# Patient Record
Sex: Female | Born: 1950 | Race: White | Hispanic: No | Marital: Married | Smoking: Never smoker
Health system: Southern US, Community
[De-identification: ages and names within clinical notes are randomized; demographics above are authoritative.]

## PROBLEM LIST (undated history)

## (undated) DIAGNOSIS — Z9289 Personal history of other medical treatment: Secondary | ICD-10-CM

## (undated) DIAGNOSIS — M810 Age-related osteoporosis without current pathological fracture: Secondary | ICD-10-CM

## (undated) DIAGNOSIS — K219 Gastro-esophageal reflux disease without esophagitis: Secondary | ICD-10-CM

## (undated) DIAGNOSIS — Z8709 Personal history of other diseases of the respiratory system: Secondary | ICD-10-CM

## (undated) DIAGNOSIS — E039 Hypothyroidism, unspecified: Secondary | ICD-10-CM

## (undated) DIAGNOSIS — Q899 Congenital malformation, unspecified: Secondary | ICD-10-CM

## (undated) DIAGNOSIS — R011 Cardiac murmur, unspecified: Secondary | ICD-10-CM

## (undated) DIAGNOSIS — H25019 Cortical age-related cataract, unspecified eye: Secondary | ICD-10-CM

## (undated) HISTORY — PX: APPENDECTOMY: SHX54

## (undated) HISTORY — PX: COLONOSCOPY: SHX174

## (undated) HISTORY — PX: CYSTECTOMY: SUR359

## (undated) HISTORY — PX: ESOPHAGOGASTRODUODENOSCOPY: SHX1529

## (undated) HISTORY — PX: BREAST CYST ASPIRATION: SHX578

## (undated) HISTORY — PX: CHOLECYSTECTOMY: SHX55

---

## 2008-02-02 ENCOUNTER — Ambulatory Visit: Payer: Self-pay | Admitting: Family Medicine

## 2009-06-29 ENCOUNTER — Ambulatory Visit: Payer: Self-pay | Admitting: Obstetrics and Gynecology

## 2010-07-10 ENCOUNTER — Ambulatory Visit: Payer: Self-pay | Admitting: Obstetrics and Gynecology

## 2010-07-17 ENCOUNTER — Ambulatory Visit: Payer: Self-pay | Admitting: Obstetrics and Gynecology

## 2011-08-27 ENCOUNTER — Ambulatory Visit: Payer: Self-pay | Admitting: Obstetrics and Gynecology

## 2012-02-04 ENCOUNTER — Ambulatory Visit: Payer: Self-pay | Admitting: Internal Medicine

## 2012-09-01 ENCOUNTER — Ambulatory Visit: Payer: Self-pay | Admitting: Obstetrics and Gynecology

## 2018-01-01 ENCOUNTER — Other Ambulatory Visit: Payer: Self-pay

## 2018-01-02 ENCOUNTER — Other Ambulatory Visit: Payer: Self-pay

## 2018-01-02 ENCOUNTER — Encounter: Payer: Self-pay | Admitting: *Deleted

## 2018-01-05 ENCOUNTER — Other Ambulatory Visit: Payer: Self-pay

## 2018-01-05 ENCOUNTER — Encounter: Payer: Self-pay | Admitting: Orthopedic Surgery

## 2018-01-05 ENCOUNTER — Ambulatory Visit: Payer: Medicare Other | Admitting: Anesthesiology

## 2018-01-05 ENCOUNTER — Ambulatory Visit
Admission: RE | Admit: 2018-01-05 | Discharge: 2018-01-05 | Disposition: A | Discharge status: Home / Self Care | Payer: Medicare Other | Source: Ambulatory Visit | Attending: Orthopedic Surgery | Admitting: Orthopedic Surgery

## 2018-01-05 ENCOUNTER — Encounter
Admission: RE | Disposition: A | Discharge status: Home / Self Care | Payer: Self-pay | Source: Ambulatory Visit | Attending: Orthopedic Surgery

## 2018-01-05 DIAGNOSIS — E039 Hypothyroidism, unspecified: Secondary | ICD-10-CM | POA: Insufficient documentation

## 2018-01-05 DIAGNOSIS — K219 Gastro-esophageal reflux disease without esophagitis: Secondary | ICD-10-CM | POA: Diagnosis not present

## 2018-01-05 DIAGNOSIS — Z885 Allergy status to narcotic agent status: Secondary | ICD-10-CM | POA: Diagnosis not present

## 2018-01-05 DIAGNOSIS — Z79899 Other long term (current) drug therapy: Secondary | ICD-10-CM | POA: Insufficient documentation

## 2018-01-05 DIAGNOSIS — Z880 Allergy status to penicillin: Secondary | ICD-10-CM | POA: Insufficient documentation

## 2018-01-05 DIAGNOSIS — M810 Age-related osteoporosis without current pathological fracture: Secondary | ICD-10-CM | POA: Diagnosis not present

## 2018-01-05 DIAGNOSIS — R490 Dysphonia: Secondary | ICD-10-CM | POA: Insufficient documentation

## 2018-01-05 DIAGNOSIS — G5601 Carpal tunnel syndrome, right upper limb: Secondary | ICD-10-CM | POA: Diagnosis present

## 2018-01-05 DIAGNOSIS — R011 Cardiac murmur, unspecified: Secondary | ICD-10-CM | POA: Insufficient documentation

## 2018-01-05 DIAGNOSIS — Z888 Allergy status to other drugs, medicaments and biological substances status: Secondary | ICD-10-CM | POA: Insufficient documentation

## 2018-01-05 DIAGNOSIS — Z01810 Encounter for preprocedural cardiovascular examination: Secondary | ICD-10-CM | POA: Diagnosis not present

## 2018-01-05 DIAGNOSIS — G5603 Carpal tunnel syndrome, bilateral upper limbs: Secondary | ICD-10-CM | POA: Insufficient documentation

## 2018-01-05 DIAGNOSIS — Z9049 Acquired absence of other specified parts of digestive tract: Secondary | ICD-10-CM | POA: Insufficient documentation

## 2018-01-05 HISTORY — DX: Hypothyroidism, unspecified: E03.9

## 2018-01-05 HISTORY — DX: Age-related osteoporosis without current pathological fracture: M81.0

## 2018-01-05 HISTORY — DX: Personal history of other medical treatment: Z92.89

## 2018-01-05 HISTORY — DX: Cortical age-related cataract, unspecified eye: H25.019

## 2018-01-05 HISTORY — DX: Gastro-esophageal reflux disease without esophagitis: K21.9

## 2018-01-05 HISTORY — DX: Cardiac murmur, unspecified: R01.1

## 2018-01-05 HISTORY — DX: Congenital malformation, unspecified: Q89.9

## 2018-01-05 HISTORY — PX: CARPAL TUNNEL RELEASE: SHX101

## 2018-01-05 HISTORY — DX: Personal history of other diseases of the respiratory system: Z87.09

## 2018-01-05 SURGERY — CARPAL TUNNEL RELEASE
Anesthesia: Regional | Laterality: Right

## 2018-01-05 MED ORDER — FENTANYL CITRATE (PF) 100 MCG/2ML IJ SOLN: 25.0000 ug | INTRAMUSCULAR | Status: DC | PRN

## 2018-01-05 MED ORDER — HYDROCODONE-ACETAMINOPHEN 5-325 MG PO TABS: 1.0000 | ORAL_TABLET | ORAL | 0 refills | Status: AC | PRN

## 2018-01-05 MED ORDER — PROPOFOL 500 MG/50ML IV EMUL
INTRAVENOUS | Status: DC | PRN
  Administered 2018-01-05: 50 ug/kg/min via INTRAVENOUS

## 2018-01-05 MED ORDER — LIDOCAINE HCL (PF) 0.5 % IJ SOLN
INTRAMUSCULAR | Status: DC | PRN
  Administered 2018-01-05: 50 mL via INTRAVENOUS

## 2018-01-05 MED ORDER — PROPOFOL 10 MG/ML IV BOLUS
INTRAVENOUS | Status: AC
  Filled 2018-01-05: qty 20

## 2018-01-05 MED ORDER — KETAMINE HCL 50 MG/ML IJ SOLN
INTRAMUSCULAR | Status: AC
  Filled 2018-01-05: qty 10

## 2018-01-05 MED ORDER — LACTATED RINGERS IV SOLN
INTRAVENOUS | Status: DC
  Administered 2018-01-05: 13:00:00 via INTRAVENOUS

## 2018-01-05 MED ORDER — ACETAMINOPHEN 10 MG/ML IV SOLN
INTRAVENOUS | Status: DC | PRN
  Administered 2018-01-05: 1000 mg via INTRAVENOUS

## 2018-01-05 MED ORDER — BUPIVACAINE HCL (PF) 0.25 % IJ SOLN
INTRAMUSCULAR | Status: DC | PRN
  Administered 2018-01-05: 10 mL

## 2018-01-05 MED ORDER — LIDOCAINE HCL (PF) 0.5 % IJ SOLN
INTRAMUSCULAR | Status: AC
  Filled 2018-01-05: qty 50

## 2018-01-05 MED ORDER — MIDAZOLAM HCL 5 MG/5ML IJ SOLN
INTRAMUSCULAR | Status: DC | PRN
  Administered 2018-01-05: 2 mg via INTRAVENOUS

## 2018-01-05 MED ORDER — HYDROCODONE-ACETAMINOPHEN 5-325 MG PO TABS
ORAL_TABLET | ORAL | Status: AC
  Administered 2018-01-05: 1
  Filled 2018-01-05: qty 1

## 2018-01-05 MED ORDER — SODIUM CHLORIDE FLUSH 0.9 % IV SOLN
INTRAVENOUS | Status: AC
  Filled 2018-01-05: qty 10

## 2018-01-05 MED ORDER — ACETAMINOPHEN 10 MG/ML IV SOLN
INTRAVENOUS | Status: AC
  Filled 2018-01-05: qty 100

## 2018-01-05 MED ORDER — NEOMYCIN-POLYMYXIN B GU 40-200000 IR SOLN
Status: DC | PRN
  Administered 2018-01-05: 2 mL

## 2018-01-05 MED ORDER — MIDAZOLAM HCL 2 MG/2ML IJ SOLN
INTRAMUSCULAR | Status: AC
  Filled 2018-01-05: qty 2

## 2018-01-05 MED ORDER — KETAMINE HCL 50 MG/ML IJ SOLN
INTRAMUSCULAR | Status: DC | PRN
  Administered 2018-01-05: 25 mg via INTRAMUSCULAR

## 2018-01-05 MED ORDER — CHLORHEXIDINE GLUCONATE 4 % EX LIQD
60.0000 mL | Freq: Once | CUTANEOUS | Status: AC
  Administered 2018-01-05: 4 via TOPICAL

## 2018-01-05 SURGICAL SUPPLY — 26 items
BANDAGE ELASTIC 3 LF NS (GAUZE/BANDAGES/DRESSINGS) ×3 IMPLANT
BNDG ESMARK 4X12 TAN STRL LF (GAUZE/BANDAGES/DRESSINGS) ×3 IMPLANT
CANISTER SUCT 1200ML W/VALVE (MISCELLANEOUS) ×3 IMPLANT
CAST PADDING 3X4FT ST 30246 (SOFTGOODS) ×2
CUFF DUAL TOURNIQUET 18IN DISP (TOURNIQUET CUFF) ×2 IMPLANT
CUFF TOURN 18 STER (MISCELLANEOUS) ×3 IMPLANT
DRSG DERMACEA 8X12 NADH (GAUZE/BANDAGES/DRESSINGS) ×3 IMPLANT
DURAPREP 26ML APPLICATOR (WOUND CARE) ×3 IMPLANT
ELECT CAUTERY BLADE 6.4 (BLADE) ×3 IMPLANT
ELECT REM PT RETURN 9FT ADLT (ELECTROSURGICAL) ×3
ELECTRODE REM PT RTRN 9FT ADLT (ELECTROSURGICAL) ×1 IMPLANT
GAUZE SPONGE 4X4 12PLY STRL (GAUZE/BANDAGES/DRESSINGS) ×3 IMPLANT
GLOVE BIOGEL M STRL SZ7.5 (GLOVE) ×3 IMPLANT
GLOVE INDICATOR 8.0 STRL GRN (GLOVE) ×3 IMPLANT
GOWN STRL REUS W/ TWL LRG LVL3 (GOWN DISPOSABLE) ×2 IMPLANT
GOWN STRL REUS W/TWL LRG LVL3 (GOWN DISPOSABLE) ×4
KIT TURNOVER KIT A (KITS) ×3 IMPLANT
NS IRRIG 500ML POUR BTL (IV SOLUTION) ×3 IMPLANT
PACK EXTREMITY ARMC (MISCELLANEOUS) ×3 IMPLANT
PAD CAST CTTN 3X4 STRL (SOFTGOODS) ×1 IMPLANT
SOL PREP PVP 2OZ (MISCELLANEOUS) ×3
SOLUTION PREP PVP 2OZ (MISCELLANEOUS) ×1 IMPLANT
SPLINT CAST 1 STEP 3X12 (MISCELLANEOUS) ×3 IMPLANT
STOCKINETTE 48X4 2 PLY STRL (GAUZE/BANDAGES/DRESSINGS) ×1 IMPLANT
STOCKINETTE STRL 4IN 9604848 (GAUZE/BANDAGES/DRESSINGS) ×3 IMPLANT
SUT ETHILON 5-0 FS-2 18 BLK (SUTURE) ×3 IMPLANT

## 2018-01-05 NOTE — Discharge Instructions (Signed)
  Instructions after Hand / Wrist Surgery   James P. Hooten, Jr., M.D.  Dept. of Orthopaedics & Sports Medicine  [facility]  [address]  [place], [place]  [address]   Phone: [phone]   Fax: [phone]   DIET: . Drink plenty of non-alcoholic fluids & begin a light diet. . Resume your normal diet the day after surgery.  ACTIVITY:  . Keep the hand elevated above the level of the elbow. . Begin gently moving the fingers on a regular basis to avoid stiffness. . Avoid any heavy lifting, pushing, or pulling with the operative hand. . Do not drive or operate any equipment until instructed.  WOUND CARE:  . Keep the splint/bandage clean and dry.  . The splint and stitches will be removed in the office. . Continue to use the ice packs periodically to reduce pain and swelling. . You may bathe or shower after the stitches are removed at the first office visit following surgery.  MEDICATIONS: . You may resume your regular medications. . Please take the pain medication as prescribed. . Do not take pain medication on an empty stomach. . Do not drive or drink alcoholic beverages when taking pain medications.  CALL THE OFFICE FOR: . Temperature above 101 degrees . Excessive bleeding or drainage on the dressing. . Excessive swelling, coldness, or paleness of the fingers. . Persistent nausea and vomiting.  FOLLOW-UP:  . You should have an appointment to return to the office in 7-10 days after surgery.   REMEMBER: R.I.C.E. = Rest, Ice, Compression, Elevation !    AMBULATORY SURGERY  DISCHARGE INSTRUCTIONS   1) The drugs that you were given will stay in your system until tomorrow so for the next 24 hours you should not:  A) Drive an automobile B) Make any legal decisions C) Drink any alcoholic beverage   2) You may resume regular meals tomorrow.  Today it is better to start with liquids and gradually work up to solid foods.  You may eat anything you prefer, but it  is better to start with liquids, then soup and crackers, and gradually work up to solid foods.   3) Please notify your doctor immediately if you have any unusual bleeding, trouble breathing, redness and pain at the surgery site, drainage, fever, or pain not relieved by medication.    4) Additional Instructions: TAKE A STOOL SOFTENER TWICE A DAY WHILE TAKING NARCOTIC PAIN MEDICINE TO PREVENT CONSTIPATION   Please contact your physician with any problems or Same Day Surgery at [phone], Monday through Friday 6 am to 4 pm, or [place] [facility] number at [phone].   

## 2018-01-05 NOTE — Anesthesia Preprocedure Evaluation (Addendum)
Anesthesia Evaluation  Patient identified by MRN, date of birth, ID band Patient awake    Reviewed: Allergy & Precautions, H&P , NPO status , Patient's Chart, lab work & pertinent test results  History of Anesthesia Complications Negative for: history of anesthetic complications  Airway Mallampati: III  TM Distance: <3 FB Neck ROM: limited    Dental  (+) Chipped, Poor Dentition   Pulmonary neg pulmonary ROS, neg shortness of breath,           Cardiovascular Exercise Tolerance: Good (-) angina(-) Past MI and (-) DOE + Valvular Problems/Murmurs      Neuro/Psych negative neurological ROS  negative psych ROS   GI/Hepatic Neg liver ROS, GERD  ,  Endo/Other  Hypothyroidism   Renal/GU negative Renal ROS  negative genitourinary   Musculoskeletal   Abdominal   Peds  Hematology negative hematology ROS (+)   Anesthesia Other Findings Patient has vocal hoarseness at baseline  Past Medical History: No date: Cataract cortical, 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 No date: GERD (gastroesophageal reflux disease) No date: H/O vocal cord paralysis No date: Heart murmur No date: History of blood transfusion No date: Hypothyroidism No date: Osteoporosis  Past Surgical History: No date: APPENDECTOMY No date: CHOLECYSTECTOMY No date: COLONOSCOPY No date: CYSTECTOMY     Comment:  voal cord, granuloma from vocal cord No date: ESOPHAGOGASTRODUODENOSCOPY  BMI    Body Mass Index:  25.06 kg/m      Reproductive/Obstetrics negative OB ROS                            Anesthesia Physical Anesthesia Plan  ASA: III  Anesthesia Plan: General and Regional   Post-op Pain Management:    Induction: Intravenous  PONV Risk Score and Plan: Ondansetron, Dexamethasone, Propofol infusion, Midazolam and TIVA  Airway Management Planned: Natural Airway and Nasal Cannula  Additional Equipment:   Intra-op Plan:    Post-operative Plan:   Informed Consent: I have reviewed the patients History and Physical, chart, labs and discussed the procedure including the risks, benefits and alternatives for the proposed anesthesia with the patient or authorized representative who has indicated his/her understanding and acceptance.   Dental Advisory Given  Plan Discussed with: Anesthesiologist, CRNA and Surgeon  Anesthesia Plan Comments: (Patient request Bier block  Patient consented for risks of anesthesia including but not limited to:  - adverse reactions to medications - risk of intubation if required - damage to teeth, lips or other oral mucosa - sore throat or hoarseness - Damage to heart, brain, lungs or loss of life  Patient voiced understanding.)        Anesthesia Quick Evaluation

## 2018-01-05 NOTE — Transfer of Care (Signed)
Immediate Anesthesia Transfer of Care Note  Patient: Tabitha Anderson  Procedure(s) Performed: CARPAL TUNNEL RELEASE (Right )  Patient Location: PACU  Anesthesia Type:Bier block  Level of Consciousness: awake, alert  and oriented  Airway & Oxygen Therapy: Patient Spontanous Breathing  Post-op Assessment: Report given to RN and Post -op Vital signs reviewed and stable  Post vital signs: Reviewed and stable  Last Vitals:  Vitals Value Taken Time  BP 119/69 01/05/2018  3:53 PM  Temp    Pulse 71 01/05/2018  3:53 PM  Resp 15 01/05/2018  3:53 PM  SpO2 97 % 01/05/2018  3:53 PM  Vitals shown include unvalidated device data.  Last Pain:  Vitals:   01/05/18 1300  TempSrc: Temporal  PainSc: 0-No pain         Complications: No apparent anesthesia complications

## 2018-01-05 NOTE — H&P (Signed)
The patient has been re-examined, and the chart reviewed, and there have been no interval changes to the documented history and physical.    The risks, benefits, and alternatives have been discussed at length. The patient expressed understanding of the risks benefits and agreed with plans for surgical intervention.  James P. Hooten, Jr. M.D.    

## 2018-01-05 NOTE — Anesthesia Procedure Notes (Signed)
Anesthesia Regional Block: Bier block (IV Regional)   Pre-Anesthetic Checklist: ,, timeout performed, Correct Patient, Correct Site, Correct Laterality, Correct Procedure, Correct Position, site marked, Risks and benefits discussed,  Surgical consent,  Pre-op evaluation,  At surgeon's request and post-op pain management  Laterality: Upper and Right     Needles:  Injection technique: Single-shot  Needle Type: Other          Additional Needles:   Procedures:,,,,,, Esmarch exsanguination,, double tourniquet utilized  Narrative:  Start time: 01/05/2018 2:55 PM End time: 01/05/2018 2:57 PM Injection made incrementally with aspirations every 5 mL.  Performed by: With CRNAs  Anesthesiologist: Loyalty Brashier, Cleda MccreedyJoseph K, MD CRNA: Omer JackWeatherly, Janice, CRNA  Additional Notes: Functioning IV was confirmed and monitors were applied.  The patient tolerated the procedure well with no immediate complications.

## 2018-01-05 NOTE — OR Nursing (Signed)
Incentive spirometer taught with return demonstration.

## 2018-01-05 NOTE — Anesthesia Post-op Follow-up Note (Signed)
Anesthesia QCDR form completed.        

## 2018-01-05 NOTE — Op Note (Signed)
OPERATIVE NOTE  DATE OF SURGERY:  01/05/2018  PATIENT NAME:  Tabitha Anderson   DOB: Oct 21, 1950  MRN: [id number]  PRE-OPERATIVE DIAGNOSIS: Right carpal tunnel syndrome  POST-OPERATIVE DIAGNOSIS:  Same  PROCEDURE:  Right carpal tunnel release  SURGEON:  Jena GaussJames P Giovannie Scerbo, Jr. M.D.  ANESTHESIA: Bier block  ESTIMATED BLOOD LOSS: Minimal  FLUIDS REPLACED: 500 mL of crystalloid  TOURNIQUET TIME: 41 minutes  DRAINS: None  INDICATIONS FOR SURGERY: Tabitha Anderson is a 67 y.o. year old female with a long history of numbness and paresthesias to the right hand. EMG/nerve conduction studies demonstrated findings consistent with carpal tunnel syndrome.The patient had not seen any significant improvement despite conservative nonsurgical intervention. After discussion of the risks and benefits of surgical intervention, the patient expressed understanding of the risks benefits and agree with plans for carpal tunnel release.   PROCEDURE IN DETAIL: The patient was brought into the operating room and after adequate Bier block,the right hand and arm were prepped with alcohol and Duraprep and draped in the usual sterile fashion. A "time-out" was performed as per usual protocol.Tabitha Anderson. Loupe magnification was used throughout the procedure. An incision was made just ulnar to the thenar palmar crease. Dissection was carried down through the palmar fascia to the transverse carpal ligament. The transverse carpal ligament was sharply incised, taking care to protect the underlying structures with the carpal tunnel. Complete release of the transverse carpal ligament was achieved. There was no evidence of ganglion cyst or lipoma within the carpal tunnel. The wound was irrigated with copious amounts of normal saline with antibiotic solution. The skin was then re-approximated with interrupted sutures of #5-0 nylon. A sterile dressing was applied followed by application of a volar splint. The tourniquet was deflated with a total  tourniquet time of 41 minutes.  The patient tolerated the procedure well and was transported to the PACU in stable condition.  Tabitha Anderson, Jr., M.D.

## 2018-01-06 ENCOUNTER — Encounter: Payer: Self-pay | Admitting: Orthopedic Surgery

## 2018-01-07 NOTE — Anesthesia Postprocedure Evaluation (Signed)
Anesthesia Post Note  Patient: Tabitha Anderson  Procedure(s) Performed: CARPAL TUNNEL RELEASE (Right )  Patient location during evaluation: PACU Anesthesia Type: Regional Level of consciousness: awake and alert Pain management: pain level controlled Vital Signs Assessment: post-procedure vital signs reviewed and stable Respiratory status: spontaneous breathing, nonlabored ventilation, respiratory function stable and patient connected to nasal cannula oxygen Cardiovascular status: blood pressure returned to baseline and stable Postop Assessment: no apparent nausea or vomiting Anesthetic complications: no     Last Vitals:  Vitals:   01/05/18 1639 01/05/18 1723  BP: (!) 144/79 128/73  Pulse: (!) 59 67  Resp: 14 14  Temp: (!) 36.1 C   SpO2: 100% 100%    Last Pain:  Vitals:   01/06/18 0829  TempSrc:   PainSc: 0-No pain                 Cleda MccreedyJoseph K Ivis Henneman

## 2018-07-29 ENCOUNTER — Other Ambulatory Visit: Payer: Self-pay | Admitting: Family Medicine

## 2018-07-29 DIAGNOSIS — Z1231 Encounter for screening mammogram for malignant neoplasm of breast: Secondary | ICD-10-CM

## 2018-09-24 ENCOUNTER — Ambulatory Visit: Payer: Medicare Other

## 2018-09-24 ENCOUNTER — Ambulatory Visit
Admission: RE | Admit: 2018-09-24 | Discharge: 2018-09-24 | Disposition: A | Discharge status: Home / Self Care | Payer: Medicare Other | Source: Ambulatory Visit | Attending: Family Medicine | Admitting: Family Medicine

## 2018-09-24 ENCOUNTER — Encounter (INDEPENDENT_AMBULATORY_CARE_PROVIDER_SITE_OTHER): Payer: Self-pay

## 2018-09-24 DIAGNOSIS — Z1231 Encounter for screening mammogram for malignant neoplasm of breast: Secondary | ICD-10-CM | POA: Diagnosis present

## 2018-09-30 ENCOUNTER — Inpatient Hospital Stay: Admission: RE | Admit: 2018-09-30 | Payer: Medicare Other | Source: Ambulatory Visit

## 2019-08-06 ENCOUNTER — Other Ambulatory Visit: Payer: Self-pay | Admitting: Family Medicine

## 2019-08-06 DIAGNOSIS — Z1231 Encounter for screening mammogram for malignant neoplasm of breast: Secondary | ICD-10-CM

## 2019-08-06 DIAGNOSIS — M810 Age-related osteoporosis without current pathological fracture: Secondary | ICD-10-CM

## 2019-10-05 ENCOUNTER — Ambulatory Visit
Admission: RE | Admit: 2019-10-05 | Discharge: 2019-10-05 | Disposition: A | Discharge status: Home / Self Care | Payer: Medicare Other | Source: Ambulatory Visit | Attending: Family Medicine | Admitting: Family Medicine

## 2019-10-05 ENCOUNTER — Other Ambulatory Visit: Payer: Self-pay

## 2019-10-05 DIAGNOSIS — Z1231 Encounter for screening mammogram for malignant neoplasm of breast: Secondary | ICD-10-CM | POA: Insufficient documentation

## 2019-10-05 DIAGNOSIS — M810 Age-related osteoporosis without current pathological fracture: Secondary | ICD-10-CM | POA: Diagnosis not present

## 2020-03-09 ENCOUNTER — Other Ambulatory Visit: Payer: Self-pay | Admitting: Orthopedic Surgery

## 2020-03-09 DIAGNOSIS — M5012 Mid-cervical disc disorder, unspecified level: Secondary | ICD-10-CM

## 2020-03-24 ENCOUNTER — Ambulatory Visit: Payer: Medicare Other

## 2020-08-21 ENCOUNTER — Other Ambulatory Visit: Payer: Self-pay | Admitting: Family Medicine

## 2020-08-21 DIAGNOSIS — Z1231 Encounter for screening mammogram for malignant neoplasm of breast: Secondary | ICD-10-CM

## 2020-11-06 ENCOUNTER — Other Ambulatory Visit: Payer: Self-pay

## 2020-11-06 ENCOUNTER — Ambulatory Visit
Admission: RE | Admit: 2020-11-06 | Discharge: 2020-11-06 | Disposition: A | Discharge status: Home / Self Care | Payer: Medicare Other | Source: Ambulatory Visit | Attending: Family Medicine | Admitting: Family Medicine

## 2020-11-06 DIAGNOSIS — Z1231 Encounter for screening mammogram for malignant neoplasm of breast: Secondary | ICD-10-CM | POA: Diagnosis not present

## 2021-07-23 IMAGING — MG DIGITAL SCREENING BILAT W/ TOMO W/ CAD
8 series · 8 of 24 positions shown · non-contrast
Comparison: Previous exam(s).

CLINICAL DATA: Screening.

EXAM:
DIGITAL SCREENING BILATERAL MAMMOGRAM WITH TOMO AND CAD

[L CC synth-2D]
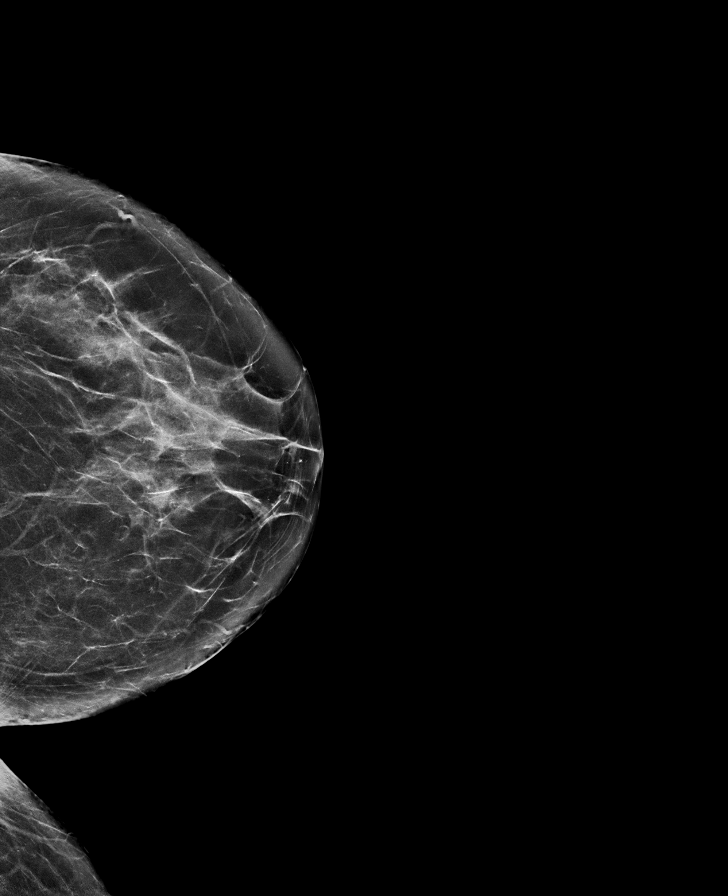

[R CC synth-2D]
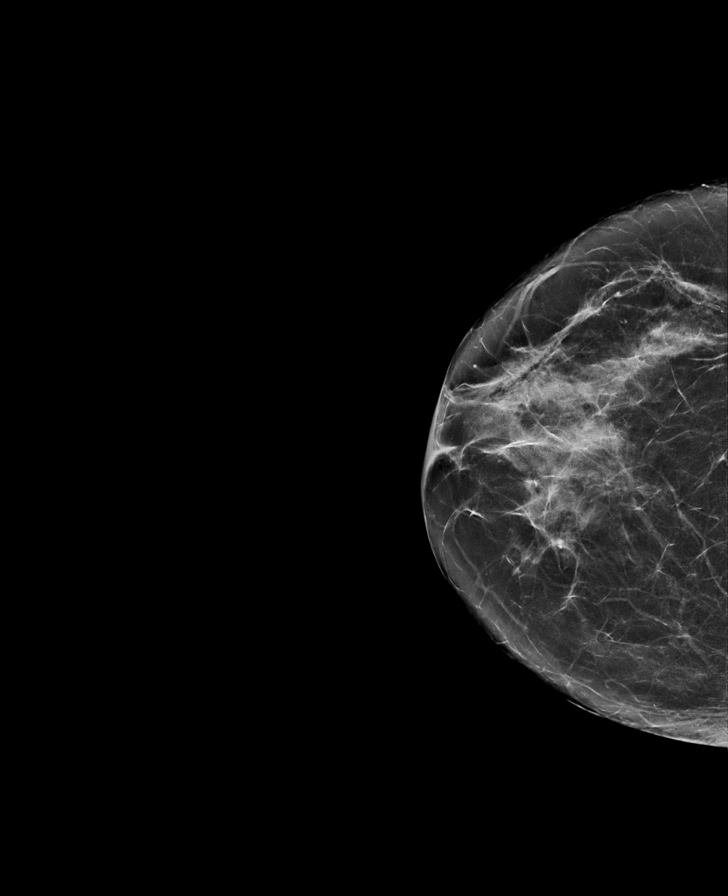

[R MLO synth-2D]
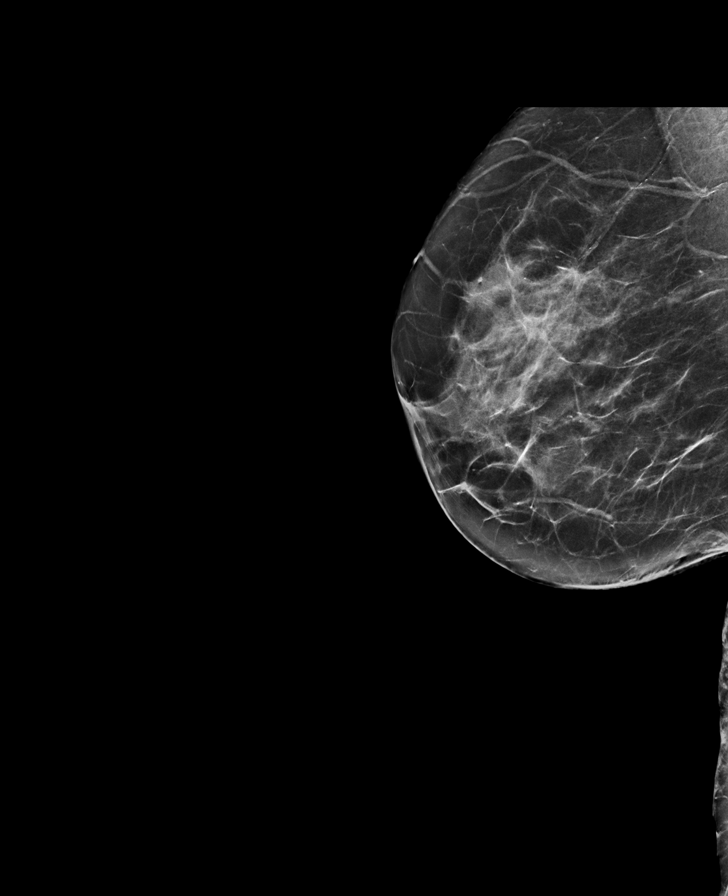

[L MLO synth-2D]
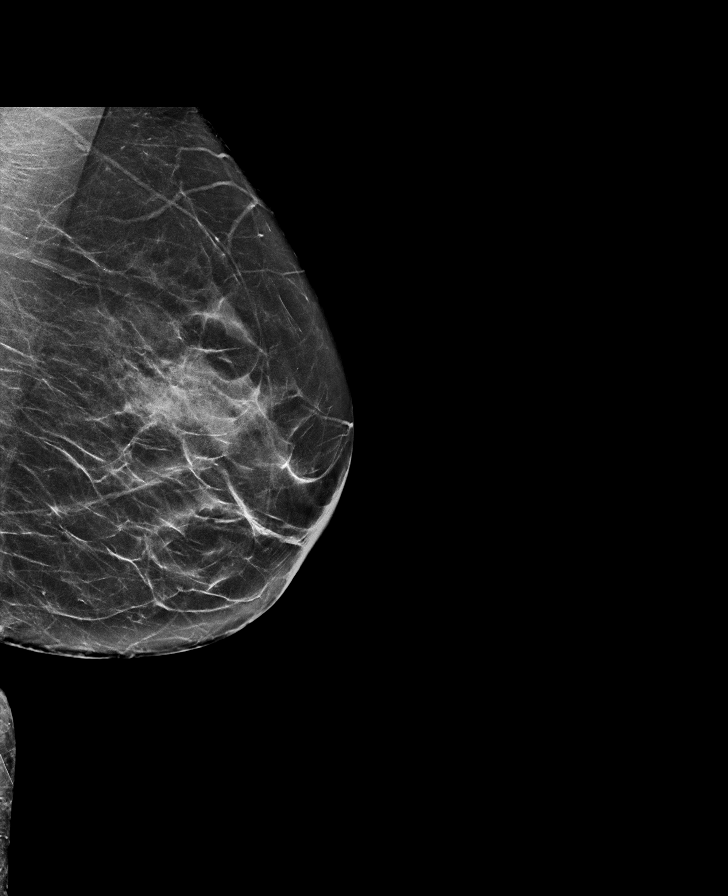

[L MLO tomo · tomo slice 34/67.0]
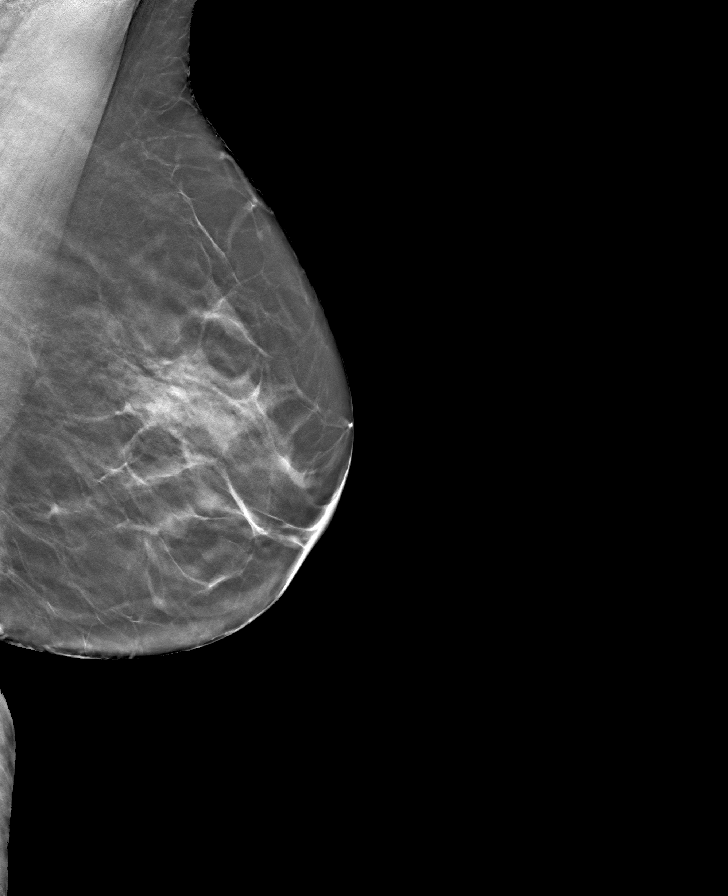

[L CC tomo · tomo slice 35/69.0]
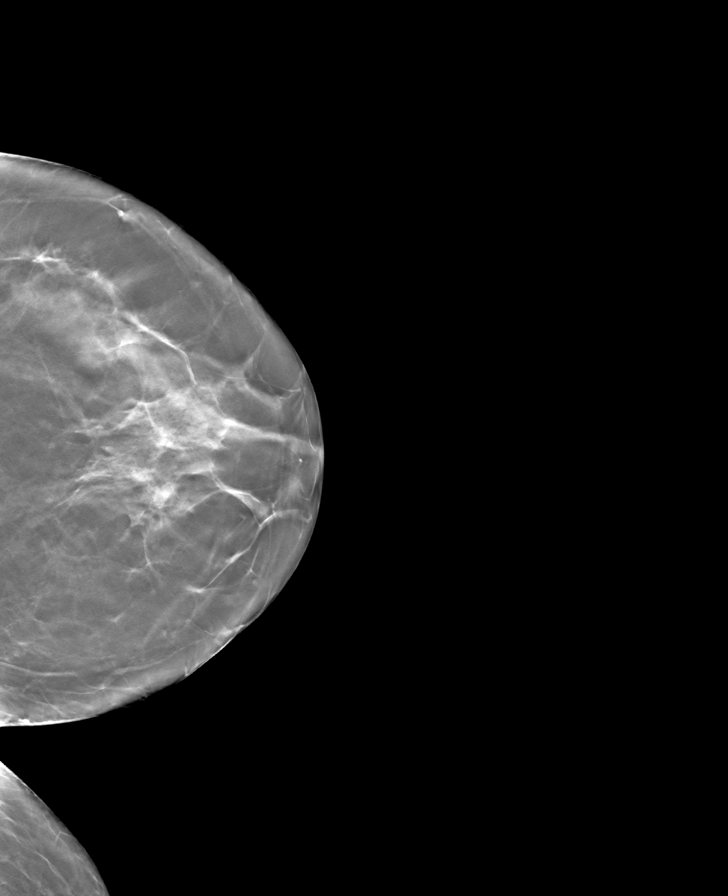

[R MLO tomo · tomo slice 35/68.0]
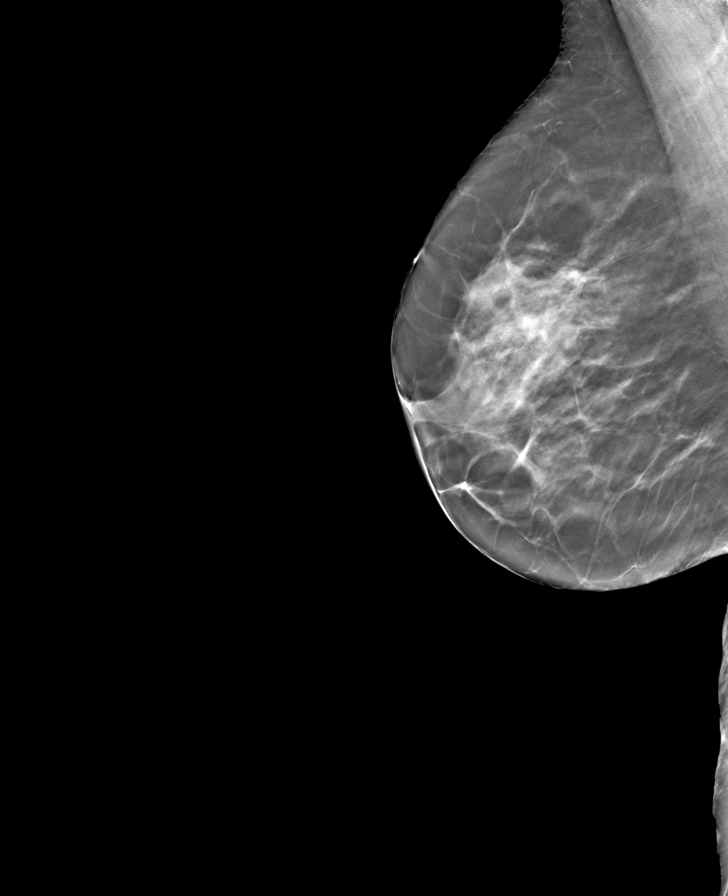

[R CC tomo · tomo slice 33/64.0]
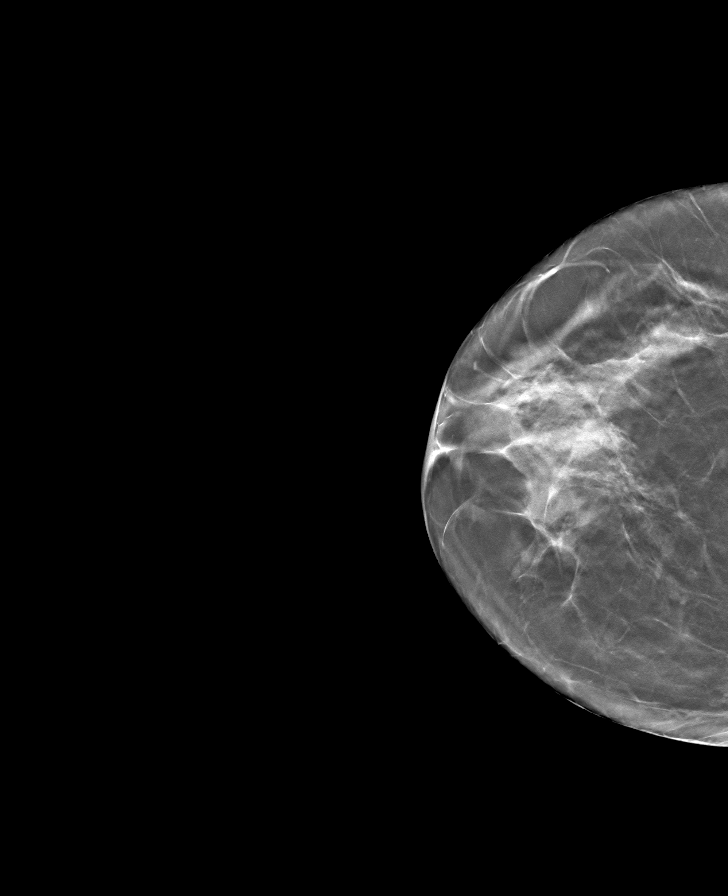

[8 of 24 positions shown; findings below may reference images not displayed]

ACR Breast Density Category c: The breast tissue is heterogeneously
dense, which may obscure small masses.
FINDINGS: There are no findings suspicious for malignancy. Images were
processed with CAD.
IMPRESSION: No mammographic evidence of malignancy. A result letter of this
screening mammogram will be mailed directly to the patient.

RECOMMENDATION:
Screening mammogram in one year. (Code:[id number])

BI-RADS CATEGORY  1: Negative.

## 2021-09-05 ENCOUNTER — Other Ambulatory Visit: Payer: Self-pay | Admitting: Family Medicine

## 2021-09-05 DIAGNOSIS — Z1231 Encounter for screening mammogram for malignant neoplasm of breast: Secondary | ICD-10-CM

## 2021-11-08 ENCOUNTER — Ambulatory Visit
Admission: RE | Admit: 2021-11-08 | Discharge: 2021-11-08 | Disposition: A | Discharge status: Home / Self Care | Payer: Medicare Other | Source: Ambulatory Visit | Attending: Family Medicine | Admitting: Family Medicine

## 2021-11-08 ENCOUNTER — Other Ambulatory Visit: Payer: Self-pay

## 2021-11-08 DIAGNOSIS — Z1231 Encounter for screening mammogram for malignant neoplasm of breast: Secondary | ICD-10-CM | POA: Diagnosis present

## 2022-09-25 ENCOUNTER — Other Ambulatory Visit: Payer: Self-pay | Admitting: Family Medicine

## 2022-09-25 DIAGNOSIS — Z1231 Encounter for screening mammogram for malignant neoplasm of breast: Secondary | ICD-10-CM

## 2022-10-17 ENCOUNTER — Other Ambulatory Visit: Payer: Self-pay | Admitting: Student

## 2022-10-17 DIAGNOSIS — H93A9 Pulsatile tinnitus, unspecified ear: Secondary | ICD-10-CM

## 2022-11-05 ENCOUNTER — Other Ambulatory Visit: Payer: Self-pay | Admitting: Student

## 2022-11-05 ENCOUNTER — Ambulatory Visit
Admission: RE | Admit: 2022-11-05 | Discharge: 2022-11-05 | Disposition: A | Discharge status: Home / Self Care | Payer: Medicare Other | Source: Ambulatory Visit | Attending: Student | Admitting: Student

## 2022-11-05 DIAGNOSIS — H93A9 Pulsatile tinnitus, unspecified ear: Secondary | ICD-10-CM

## 2022-11-12 ENCOUNTER — Ambulatory Visit
Admission: RE | Admit: 2022-11-12 | Discharge: 2022-11-12 | Disposition: A | Discharge status: Home / Self Care | Payer: Medicare Other | Source: Ambulatory Visit | Attending: Family Medicine | Admitting: Family Medicine

## 2022-11-12 DIAGNOSIS — Z1231 Encounter for screening mammogram for malignant neoplasm of breast: Secondary | ICD-10-CM | POA: Diagnosis not present

## 2023-09-22 ENCOUNTER — Other Ambulatory Visit: Payer: Self-pay | Admitting: Family Medicine

## 2023-09-22 DIAGNOSIS — Z1231 Encounter for screening mammogram for malignant neoplasm of breast: Secondary | ICD-10-CM

## 2023-11-14 ENCOUNTER — Ambulatory Visit
Admission: RE | Admit: 2023-11-14 | Discharge: 2023-11-14 | Disposition: A | Discharge status: Home / Self Care | Payer: Medicare Other | Source: Ambulatory Visit | Attending: Family Medicine | Admitting: Family Medicine

## 2023-11-14 DIAGNOSIS — Z1231 Encounter for screening mammogram for malignant neoplasm of breast: Secondary | ICD-10-CM | POA: Diagnosis present

## 2023-12-10 ENCOUNTER — Ambulatory Visit: Payer: Self-pay

## 2023-12-10 DIAGNOSIS — Q438 Other specified congenital malformations of intestine: Secondary | ICD-10-CM | POA: Diagnosis not present

## 2023-12-10 DIAGNOSIS — Z1211 Encounter for screening for malignant neoplasm of colon: Secondary | ICD-10-CM | POA: Diagnosis present

## 2023-12-10 DIAGNOSIS — K621 Rectal polyp: Secondary | ICD-10-CM | POA: Diagnosis not present

## 2023-12-10 DIAGNOSIS — K640 First degree hemorrhoids: Secondary | ICD-10-CM | POA: Diagnosis not present

## 2024-09-14 ENCOUNTER — Other Ambulatory Visit: Payer: Self-pay | Admitting: Family Medicine

## 2024-09-14 DIAGNOSIS — Z1231 Encounter for screening mammogram for malignant neoplasm of breast: Secondary | ICD-10-CM

## 2024-10-11 ENCOUNTER — Encounter: Payer: Self-pay | Admitting: Intensive Care

## 2024-10-11 ENCOUNTER — Other Ambulatory Visit: Payer: Self-pay

## 2024-10-11 ENCOUNTER — Emergency Department

## 2024-10-11 ENCOUNTER — Emergency Department
Admission: EM | Admit: 2024-10-11 | Discharge: 2024-10-11 | Disposition: A | Discharge status: Home / Self Care | Attending: Emergency Medicine | Admitting: Emergency Medicine

## 2024-10-11 DIAGNOSIS — R42 Dizziness and giddiness: Secondary | ICD-10-CM | POA: Insufficient documentation

## 2024-10-11 DIAGNOSIS — R002 Palpitations: Secondary | ICD-10-CM | POA: Diagnosis not present

## 2024-10-11 DIAGNOSIS — E039 Hypothyroidism, unspecified: Secondary | ICD-10-CM | POA: Diagnosis not present

## 2024-10-11 DIAGNOSIS — R000 Tachycardia, unspecified: Secondary | ICD-10-CM | POA: Insufficient documentation

## 2024-10-11 LAB — BASIC METABOLIC PANEL WITH GFR
Anion gap: 11 (ref 5–15)
BUN: 19 mg/dL (ref 8–23)
CO2: 26 mmol/L (ref 22–32)
Calcium: 9.4 mg/dL (ref 8.9–10.3)
Chloride: 104 mmol/L (ref 98–111)
Creatinine, Ser: 0.86 mg/dL (ref 0.44–1.00)
GFR, Estimated: >60 mL/min
Glucose, Bld: 129 mg/dL — ABNORMAL HIGH (ref 70–99)
Potassium: 3.9 mmol/L (ref 3.5–5.1)
Sodium: 141 mmol/L (ref 135–145)

## 2024-10-11 LAB — T4, FREE: Free T4: 1.26 ng/dL (ref 0.80–2.00)

## 2024-10-11 LAB — TSH: TSH: 2.79 u[IU]/mL (ref 0.350–4.500)

## 2024-10-11 LAB — CBC
HCT: 39.5 % (ref 36.0–46.0)
Hemoglobin: 13.9 g/dL (ref 12.0–15.0)
MCH: 30.8 pg (ref 26.0–34.0)
MCHC: 35.2 g/dL (ref 30.0–36.0)
MCV: 87.6 fL (ref 80.0–100.0)
Platelets: 260 K/uL (ref 150–400)
RBC: 4.51 MIL/uL (ref 3.87–5.11)
RDW: 13.1 % (ref 11.5–15.5)
WBC: 9 K/uL (ref 4.0–10.5)
nRBC: 0 % (ref 0.0–0.2)

## 2024-10-11 LAB — TROPONIN T, HIGH SENSITIVITY: Troponin T High Sensitivity: <15 ng/L (ref 0–19)

## 2024-10-11 MED ORDER — MECLIZINE HCL 25 MG PO TABS: 25.0000 mg | ORAL_TABLET | Freq: Three times a day (TID) | ORAL | 0 refills | Status: AC | PRN

## 2024-10-11 NOTE — ED Notes (Signed)
 Lab called again to add on TSH and free T4.

## 2024-10-11 NOTE — ED Provider Notes (Signed)
 "  [facility] Provider Note    Event Date/Time   First MD Initiated Contact with Patient 10/11/24 1619     (approximate)   History   Tachycardia and Dizziness   HPI  Tabitha Anderson is a 74 y.o. female who presents to the ED for evaluation of Tachycardia and Dizziness   Review of routine PCP visit from 1 month ago, history of hypothyroidism, GERD, orthostasis.  Patient presents to the ED with episodic palpitations and vertigo over the past 36 hours.  She reports her PCP increased her levothyroxine dosing a few weeks ago.  Patient reports while lying in bed last night she developed palpitations without any pressure or discomfort, no dyspnea.  Had some mild room spinning sensation alongside this, this self resolved in a matter of minutes.   Earlier today she was at an exercise class and with some bending over and position changes she developed vertigo and palpitations.  Vertigo quickly resolved, no syncope or falls.  She left the class early and walked out to her car and had palpitations that lingered for nearly an hour before self resolving.  Again no pain, dyspnea  Reports feeling fine right now   Physical Exam   Triage Vital Signs: ED Triage Vitals  Encounter Vitals Group     BP 10/11/24 1450 (!) 159/78     Girls Systolic BP Percentile --      Girls Diastolic BP Percentile --      Boys Systolic BP Percentile --      Boys Diastolic BP Percentile --      Pulse Rate 10/11/24 1450 89     Resp 10/11/24 1450 16     Temp 10/11/24 1450 98 F (36.7 C)     Temp Source 10/11/24 1450 Oral     SpO2 10/11/24 1450 98 %     Weight 10/11/24 1446 115 lb (52.2 kg)     Height 10/11/24 1446 5' (1.524 m)     Head Circumference --      Peak Flow --      Pain Score 10/11/24 1446 0     Pain Loc --      Pain Education --      Exclude from Growth Chart --     Most recent vital signs: Vitals:   10/11/24 1714 10/11/24 1730  BP:  109/62  Pulse: 66 62  Resp: 17 17   Temp:    SpO2: 96% 98%    General: Awake, no distress.  CV:  Good peripheral perfusion.  RRR without appreciable murmur Resp:  Normal effort.  Abd:  No distention.  MSK:  No deformity noted.  Neuro:  No focal deficits appreciated. Cranial nerves II through XII intact 5/5 strength and sensation in all 4 extremities Other:     ED Results / Procedures / Treatments   Labs (all labs ordered are listed, but only abnormal results are displayed) Labs Reviewed  BASIC METABOLIC PANEL WITH GFR - Abnormal; Notable for the following components:      Result Value   Glucose, Bld 129 (*)    All other components within normal limits  CBC  TSH  T4, FREE  TROPONIN T, HIGH SENSITIVITY    EKG Sinus rhythm with a rate of 74 bpm.  Normal axis and intervals.  No evidence of acute ischemia.  RADIOLOGY CXR interpreted by me without evidence of acute cardiopulmonary pathology.  Official radiology report(s): DG Chest 2 View Result Date: 10/11/2024 CLINICAL DATA:  Tachycardia EXAM: CHEST - 2 VIEW COMPARISON:  None Available. FINDINGS: No consolidation or pleural effusion. Normal cardiac size. Small foci of atelectasis or scarring in the left mid to lower lung. Aortic atherosclerosis. Scoliosis of the spine. IMPRESSION: No active cardiopulmonary disease. Small foci of atelectasis or scarring in the left mid to lower lung. Electronically Signed   By: Luke Bun M.D.   On: 10/11/2024 15:51    PROCEDURES and INTERVENTIONS:  .1-3 Lead EKG Interpretation  Performed by: Claudene Rover, MD Authorized by: Claudene Rover, MD     Interpretation: normal     ECG rate:  85   ECG rate assessment: normal     Rhythm: sinus rhythm     Ectopy: none     Conduction: normal     Medications - No data to display   IMPRESSION / MDM / ASSESSMENT AND PLAN / ED COURSE  I reviewed the triage vital signs and the nursing notes.  Differential diagnosis includes, but is not limited to, symptomatic hyperthyroid, BPPV,  anxiety, cardiac dysrhythmia, symptomatic anemia  {Patient presents with symptoms of an acute illness or injury that is potentially life-threatening.  Patient presents with intermittent vertigo and palpitations, with a benign workup and suitable for outpatient management.  Normal vitals and exam.  Normal CBC, metabolic panel, troponin and thyroid  screening.  No dysrhythmias while she is on telemetry.  No persistent symptoms, she is asymptomatic here.  Suspect a degree of BPPV, stress and anxiety.  Doubt cardiac dysrhythmia.  Clinical Course as of 10/11/24 1815  Mon Oct 11, 2024  1812 Reassessed, patient remains asymptomatic, no dysrhythmias on the monitor, thyroid  screening tests reassuring.  We discussed possible etiologies of her symptoms, trying meclizine  with her vertigo, discussed methods for improved sleep at night, we discussed palpitations, ED return precautions, PCP follow-up and answered her questions [DS]    Clinical Course User Index [DS] Claudene Rover, MD     FINAL CLINICAL IMPRESSION(S) / ED DIAGNOSES   Final diagnoses:  Vertigo  Palpitations     Rx / DC Orders   ED Discharge Orders          Ordered    meclizine  (ANTIVERT ) 25 MG tablet  3 times daily PRN        10/11/24 1814             Note:  This document was prepared using Dragon voice recognition software and may include unintentional dictation errors.   Claudene Rover, MD 10/11/24 1815  "

## 2024-10-11 NOTE — ED Notes (Signed)
 RN called to add on TSH and T4.

## 2024-10-11 NOTE — Discharge Instructions (Signed)
 Meclizine /Antivert  can be helpful as needed medication for vertigo and spinning sensation.  Can take up to 3 times per day as needed.  Do not need to take it if you are feeling okay  As we discussed regarding sleep, you can try over-the-counter medication doxylamine, brand-name Unisom, 25 mg at night to help with sleep

## 2024-10-11 NOTE — ED Notes (Signed)
 Per MD Claudene, d/c repeat troponin.

## 2024-10-11 NOTE — ED Triage Notes (Signed)
 Patient c/o tachycardia and dizziness that started yesterday.

## 2024-11-15 ENCOUNTER — Encounter
